# Patient Record
Sex: Female | Born: 1985 | Marital: Married | Smoking: Never smoker
Health system: Southern US, Community
[De-identification: ages and names within clinical notes are randomized; demographics above are authoritative.]

---

## 2014-09-08 ENCOUNTER — Emergency Department: Payer: Self-pay | Admitting: Emergency Medicine

## 2014-09-08 LAB — URINALYSIS, COMPLETE
BACTERIA: NONE SEEN
Bilirubin,UR: NEGATIVE
Blood: NEGATIVE
Glucose,UR: NEGATIVE mg/dL (ref 0–75)
Ketone: NEGATIVE
Leukocyte Esterase: NEGATIVE
Nitrite: NEGATIVE
Ph: 6 (ref 4.5–8.0)
Protein: NEGATIVE
RBC, UR: NONE SEEN /HPF (ref 0–5)
SPECIFIC GRAVITY: 1.015 (ref 1.003–1.030)
WBC UR: NONE SEEN /HPF (ref 0–5)

## 2014-09-08 LAB — BASIC METABOLIC PANEL
Anion Gap: 8 (ref 7–16)
BUN: 7 mg/dL (ref 7–18)
CHLORIDE: 106 mmol/L (ref 98–107)
Calcium, Total: 8.3 mg/dL — ABNORMAL LOW (ref 8.5–10.1)
Co2: 25 mmol/L (ref 21–32)
Creatinine: 0.74 mg/dL (ref 0.60–1.30)
EGFR (Non-African Amer.): >60
Glucose: 91 mg/dL (ref 65–99)
Osmolality: 275 (ref 275–301)
POTASSIUM: 3.8 mmol/L (ref 3.5–5.1)
Sodium: 139 mmol/L (ref 136–145)

## 2014-09-08 LAB — CBC
HCT: 38.4 % (ref 35.0–47.0)
HGB: 12.9 g/dL (ref 12.0–16.0)
MCH: 30.5 pg (ref 26.0–34.0)
MCHC: 33.5 g/dL (ref 32.0–36.0)
MCV: 91 fL (ref 80–100)
PLATELETS: 227 10*3/uL (ref 150–440)
RBC: 4.22 10*6/uL (ref 3.80–5.20)
RDW: 12.5 % (ref 11.5–14.5)
WBC: 10.8 10*3/uL (ref 3.6–11.0)

## 2014-09-08 LAB — HCG, QUANTITATIVE, PREGNANCY: BETA HCG, QUANT.: 167101 m[IU]/mL — AB

## 2014-09-08 LAB — WET PREP, GENITAL

## 2014-09-09 LAB — GC/CHLAMYDIA PROBE AMP

## 2014-09-17 ENCOUNTER — Ambulatory Visit: Payer: Self-pay | Admitting: Obstetrics and Gynecology

## 2014-09-17 LAB — HCG, QUANTITATIVE, PREGNANCY: Beta Hcg, Quant.: >200000 m[IU]/mL — ABNORMAL HIGH

## 2014-09-30 LAB — OB RESULTS CONSOLE VARICELLA ZOSTER ANTIBODY, IGG: VARICELLA IGG: NON-IMMUNE/NOT IMMUNE

## 2014-10-17 NOTE — L&D Delivery Note (Signed)
Delivery Note Ms. Gerilyn PilgrimJacob had a labor that was complicated by chorioamnionitis along with grade 2 meconium. She progressed well without unnecessary augmentation. She pushed with good effort, near to the point of exhaustion, but did really well with the support of her husband and nursing staff. Antiobiotics were started at the first confirmation of chorioamnionitis. At 7:21 AM a viable female was delivered via Vaginal, Spontaneous Delivery (Presentation:OA ).  APGAR: 2, 8; weight 7 lb 12.5 oz (3530 g).   Placenta status: Intact, Spontaneous.  Cord: 3 vessels with the following complications: .  Cord pH:7.13 (WNL)  Anesthesia: Epidural  Episiotomy:  None Lacerations:  2nd degree Suture Repair: vicryl 0 Est. Blood Loss (mL):  500 Mom to postpartum.  Baby to nursery due to chorioamnionitis  Sharmila Wrobleski MICHAEL 04/28/2015, 8:12 AM

## 2015-04-02 LAB — OB RESULTS CONSOLE GC/CHLAMYDIA
CHLAMYDIA, DNA PROBE: NEGATIVE
GC PROBE AMP, GENITAL: NEGATIVE

## 2015-04-02 LAB — OB RESULTS CONSOLE GBS: STREP GROUP B AG: POSITIVE

## 2015-04-02 LAB — OB RESULTS CONSOLE RPR: RPR: NONREACTIVE

## 2015-04-27 ENCOUNTER — Inpatient Hospital Stay: Payer: 59 | Admitting: Anesthesiology

## 2015-04-27 ENCOUNTER — Inpatient Hospital Stay
Admission: EM | Admit: 2015-04-27 | Discharge: 2015-04-30 | DRG: 774 | Disposition: A | Discharge status: Home / Self Care | Payer: 59 | Attending: Obstetrics and Gynecology | Admitting: Obstetrics and Gynecology

## 2015-04-27 DIAGNOSIS — O99824 Streptococcus B carrier state complicating childbirth: Secondary | ICD-10-CM | POA: Diagnosis present

## 2015-04-27 DIAGNOSIS — IMO0001 Reserved for inherently not codable concepts without codable children: Secondary | ICD-10-CM

## 2015-04-27 DIAGNOSIS — I959 Hypotension, unspecified: Secondary | ICD-10-CM | POA: Diagnosis not present

## 2015-04-27 DIAGNOSIS — Z3A39 39 weeks gestation of pregnancy: Secondary | ICD-10-CM | POA: Diagnosis present

## 2015-04-27 DIAGNOSIS — O9089 Other complications of the puerperium, not elsewhere classified: Secondary | ICD-10-CM | POA: Diagnosis not present

## 2015-04-27 DIAGNOSIS — O411230 Chorioamnionitis, third trimester, not applicable or unspecified: Principal | ICD-10-CM | POA: Diagnosis present

## 2015-04-27 LAB — CBC
HCT: 40.2 % (ref 35.0–47.0)
HEMOGLOBIN: 13.6 g/dL (ref 12.0–16.0)
MCH: 30.8 pg (ref 26.0–34.0)
MCHC: 33.7 g/dL (ref 32.0–36.0)
MCV: 91.3 fL (ref 80.0–100.0)
Platelets: 191 10*3/uL (ref 150–440)
RBC: 4.41 MIL/uL (ref 3.80–5.20)
RDW: 13.4 % (ref 11.5–14.5)
WBC: 19.2 10*3/uL — ABNORMAL HIGH (ref 3.6–11.0)

## 2015-04-27 LAB — TYPE AND SCREEN
ABO/RH(D): B POS
ANTIBODY SCREEN: NEGATIVE

## 2015-04-27 MED ORDER — SODIUM CHLORIDE 0.9 % IV SOLN
2.0000 g | Freq: Four times a day (QID) | INTRAVENOUS | Status: DC
  Administered 2015-04-28 (×2): 2 g via INTRAVENOUS
  Filled 2015-04-27 (×2): qty 2000

## 2015-04-27 MED ORDER — CITRIC ACID-SODIUM CITRATE 334-500 MG/5ML PO SOLN: 30.0000 mL | ORAL | Status: DC | PRN

## 2015-04-27 MED ORDER — ACETAMINOPHEN 325 MG PO TABS
650.0000 mg | ORAL_TABLET | ORAL | Status: DC | PRN
  Administered 2015-04-27: 650 mg via ORAL
  Filled 2015-04-27: qty 2

## 2015-04-27 MED ORDER — PENICILLIN G POTASSIUM 5000000 UNITS IJ SOLR
2.5000 10*6.[IU] | INTRAVENOUS | Status: DC
  Administered 2015-04-27: 2.5 10*6.[IU] via INTRAVENOUS
  Filled 2015-04-27 (×5): qty 2.5

## 2015-04-27 MED ORDER — GENTAMICIN SULFATE 40 MG/ML IJ SOLN
1.5000 mg/kg | Freq: Three times a day (TID) | INTRAVENOUS | Status: DC
  Administered 2015-04-28: 110 mg via INTRAVENOUS
  Filled 2015-04-27 (×5): qty 2.75

## 2015-04-27 MED ORDER — FENTANYL 2.5 MCG/ML W/ROPIVACAINE 0.2% IN NS 100 ML EPIDURAL INFUSION (ARMC-ANES)
10.0000 mL/h | EPIDURAL | Status: DC
  Administered 2015-04-27: 10 mL/h via EPIDURAL

## 2015-04-27 MED ORDER — OXYTOCIN BOLUS FROM INFUSION: 500.0000 mL | INTRAVENOUS | Status: DC

## 2015-04-27 MED ORDER — LIDOCAINE-EPINEPHRINE (PF) 1.5 %-1:200000 IJ SOLN
INTRAMUSCULAR | Status: DC | PRN
  Administered 2015-04-27: 3 mL via EPIDURAL

## 2015-04-27 MED ORDER — LACTATED RINGERS IV SOLN
INTRAVENOUS | Status: DC
  Administered 2015-04-27 (×2): via INTRAVENOUS

## 2015-04-27 MED ORDER — LIDOCAINE HCL (PF) 1 % IJ SOLN: 30.0000 mL | INTRAMUSCULAR | Status: DC | PRN

## 2015-04-27 MED ORDER — ONDANSETRON HCL 4 MG/2ML IJ SOLN: 4.0000 mg | Freq: Four times a day (QID) | INTRAMUSCULAR | Status: DC | PRN

## 2015-04-27 MED ORDER — SODIUM CHLORIDE 0.9 % IJ SOLN
INTRAMUSCULAR | Status: AC
  Filled 2015-04-27: qty 10

## 2015-04-27 MED ORDER — OXYTOCIN 40 UNITS IN LACTATED RINGERS INFUSION - SIMPLE MED
62.5000 mL/h | INTRAVENOUS | Status: DC
  Administered 2015-04-28: 40 [IU] via INTRAVENOUS

## 2015-04-27 MED ORDER — EPHEDRINE SULFATE 50 MG/ML IJ SOLN
INTRAMUSCULAR | Status: AC
  Filled 2015-04-27: qty 1

## 2015-04-27 MED ORDER — FENTANYL 2.5 MCG/ML W/ROPIVACAINE 0.2% IN NS 100 ML EPIDURAL INFUSION (ARMC-ANES)
EPIDURAL | Status: AC
  Administered 2015-04-27: 10 mL/h via EPIDURAL
  Filled 2015-04-27: qty 100

## 2015-04-27 MED ORDER — PHENYLEPHRINE 40 MCG/ML (10ML) SYRINGE FOR IV PUSH (FOR BLOOD PRESSURE SUPPORT): 80.0000 ug | PREFILLED_SYRINGE | INTRAVENOUS | Status: DC | PRN

## 2015-04-27 MED ORDER — BUPIVACAINE HCL (PF) 0.25 % IJ SOLN
INTRAMUSCULAR | Status: DC | PRN
  Administered 2015-04-27 (×2): 4 mL

## 2015-04-27 MED ORDER — PENICILLIN G POTASSIUM 5000000 UNITS IJ SOLR
5.0000 10*6.[IU] | Freq: Once | INTRAVENOUS | Status: AC
  Administered 2015-04-27: 5 10*6.[IU] via INTRAVENOUS
  Filled 2015-04-27: qty 5

## 2015-04-27 MED ORDER — EPHEDRINE 5 MG/ML INJ
10.0000 mg | Freq: Once | INTRAVENOUS | Status: AC
  Administered 2015-04-27: 5 mg via INTRAVENOUS

## 2015-04-27 MED ORDER — DIPHENHYDRAMINE HCL 50 MG/ML IJ SOLN: 12.5000 mg | INTRAMUSCULAR | Status: DC | PRN

## 2015-04-27 MED ORDER — EPHEDRINE 5 MG/ML INJ: 10.0000 mg | INTRAVENOUS | Status: DC | PRN

## 2015-04-27 MED ORDER — LACTATED RINGERS IV SOLN: 500.0000 mL | INTRAVENOUS | Status: DC | PRN

## 2015-04-27 MED ORDER — BUTORPHANOL TARTRATE 1 MG/ML IJ SOLN: 1.0000 mg | INTRAMUSCULAR | Status: DC | PRN

## 2015-04-27 NOTE — Anesthesia Preprocedure Evaluation (Signed)
Anesthesia Evaluation  Patient identified by MRN, date of birth, ID band Patient awake    Reviewed: Allergy & Precautions, NPO status , Patient's Chart, lab work & pertinent test results  History of Anesthesia Complications Negative for: history of anesthetic complications  Airway Mallampati: II  TM Distance: >3 FB Neck ROM: Full    Dental no notable dental hx.    Pulmonary neg pulmonary ROS,  breath sounds clear to auscultation  Pulmonary exam normal       Cardiovascular negative cardio ROS Normal cardiovascular examRhythm:Regular Rate:Normal     Neuro/Psych negative neurological ROS  negative psych ROS   GI/Hepatic negative GI ROS, Neg liver ROS,   Endo/Other  negative endocrine ROS  Renal/GU negative Renal ROS  negative genitourinary   Musculoskeletal negative musculoskeletal ROS (+)   Abdominal   Peds negative pediatric ROS (+)  Hematology negative hematology ROS (+)   Anesthesia Other Findings   Reproductive/Obstetrics (+) Pregnancy                             Anesthesia Physical Anesthesia Plan  ASA: II  Anesthesia Plan: Epidural   Post-op Pain Management:    Induction:   Airway Management Planned:   Additional Equipment:   Intra-op Plan:   Post-operative Plan:   Informed Consent: I have reviewed the patients History and Physical, chart, labs and discussed the procedure including the risks, benefits and alternatives for the proposed anesthesia with the patient or authorized representative who has indicated his/her understanding and acceptance.     Plan Discussed with:   Anesthesia Plan Comments:         Anesthesia Quick Evaluation  

## 2015-04-27 NOTE — Anesthesia Procedure Notes (Signed)
Epidural Patient location during procedure: OB Start time: 04/27/2015 7:45 PM End time: 04/27/2015 7:51 PM  Staffing Anesthesiologist: Forestine ChutePOLIN, Fortino Haag Performed by: anesthesiologist   Preanesthetic Checklist Completed: patient identified, site marked, surgical consent, pre-op evaluation, timeout performed, IV checked, risks and benefits discussed and monitors and equipment checked  Epidural Patient position: sitting Prep: Betadine Patient monitoring: heart rate, continuous pulse ox and blood pressure Approach: midline Location: L4-L5 Injection technique: LOR saline  Needle:  Needle type: Tuohy  Needle gauge: 18 G Needle length: 9 cm and 9 Needle insertion depth: 5 cm Catheter type: closed end flexible Catheter size: 20 Guage Catheter at skin depth: 10 cm Test dose: negative and 1.5% lidocaine with Epi 1:200 K  Assessment Events: blood not aspirated, injection not painful, no injection resistance, negative IV test and no paresthesia  Additional Notes   Patient tolerated the insertion well without complications.Reason for block:procedure for pain

## 2015-04-27 NOTE — Progress Notes (Signed)
S: Resting comfortably with epidural. + CTX, no LOF, VB O: Temp:  [98.1 F (36.7 C)-100.9 F (38.3 C)] 100.9 F (38.3 C) (07/11 2241) Pulse Rate:  [96-138] 138 (07/11 2327) Resp:  [20] 20 (07/11 2031) BP: (79-148)/(35-116) 92/50 mmHg (07/11 2327) SpO2:  [100 %] 100 % (07/11 2134) Weight:  [165 lb 5.5 oz (75 kg)] 165 lb 5.5 oz (75 kg) (07/11 1556)   Gen: NAD, AAOx3      Abd: FNTTP      Ext: Non-tender, Nonedmeatous    FHT: 170 mod var + accelerations no decelerations TOCO: Q2-3 min SVE: 7/90/0 vtx, arom'd clear fluid with blood tinged   A/P:  29 y.o. yo G1P0 at 1847w4d by 8 week US with labor complicated by chorioamnionitis.   Labor: Progressing well, chorioamnionitis now by definition of fever despite Tylenol and fetal tachycardia.   FWB: Reassuring Cat 1 tracing. EFW 3400g  GBS: + being treated  Contraception: not discussed   GOODMAN, DAVID MICHAEL 11:48 PM

## 2015-04-27 NOTE — H&P (Signed)
HISTORY AND PHYSICAL  HISTORY OF PRESENT ILLNESS: Ms. Diane Lopez is a 29 y.o. G1 at 39+4 by 8 week ultrasound with a pregnancy presenting with active labor.   She has been having contractions Q5-10 throughout today and presents now with small amount of bleeding PV, bulging bag of water and cervical change from 3 to 4 cm dilated.    She has had an uncomplicated pregnancy. B+ Rub Imm VZV non-immune GTT GTT 126 Hgb 11.8 GBS + no allx  REVIEW OF SYSTEMS: A complete review of systems was performed and was specifically negative for headache, changes in vision, RUQ pain, shortness of breath, chest pain, lower extremity edema and dysuria.   HISTORY:  No past medical history on file.  No past surgical history on file.  No current facility-administered medications on file prior to encounter.   No current outpatient prescriptions on file prior to encounter.     No Known Allergies  OB History  No data available    Gynecologic History: History of Abnormal Pap Smear: NILM History of STI: none  History  Substance Use Topics  . Smoking status: Not on file  . Smokeless tobacco: Not on file  . Alcohol Use: Not on file    PHYSICAL EXAM: Temp:  [98.1 F (36.7 C)] 98.1 F (36.7 C) (07/11 1556) Weight:  [75 kg (165 lb 5.5 oz)] 75 kg (165 lb 5.5 oz) (07/11 1556)  GENERAL: NAD AAOx3 CHEST:CTAB no increased work of breathing CV:RRR no appreciable murmurs, rubs, gallops ABDOMEN: gravid, nontender, EFW 3200g by [place] EXTREMITIES:  Warm and well-perfused, nontender, nonedematous, 2+ DTRs no clonus CERVIX: 4/ 75/ -2 BBOW SPECULUM: deferred  FHT:150s baseline with mod variability + accelerations and no decelerations  Toco: Q3  DIAGNOSTIC STUDIES: No results for input(s): WBC, HGB, HCT, PLT, NA, K, CL, CO2, BUN, CREATININE, LABGLOM, GLUCOSE, CALCIUM, BILIDIR, ALKPHOS, AST, ALT, PROT, MG in the last 168 hours.  Invalid input(s): LABALB, UA  PRENATAL STUDIES:  Prenatal Labs:  MBT: B+;  Rubella immune, Varicella non immune, HIV neg, RPR neg, Hep B neg, GC/CT neg, GBS positive, glucola 126   ASSESSMENT AND PLAN:  1. Fetal Well being  - Fetal Tracing: reassuring - Ultrasound: reviewed, as above - Group B Streptococcus: positive - Presentation: vtx confirmed by US   2. Routine OB: - Prenatal labs reviewed, as above - Rh +  3. Labor:  -  Contractions external toco in place -  Pelvis unproven -  Plan for epidural  4. Post Partum Planning: - Infant feeding: breast - Contraception: not discussed

## 2015-04-28 ENCOUNTER — Encounter: Payer: Self-pay | Admitting: *Deleted

## 2015-04-28 LAB — HIV ANTIBODY (ROUTINE TESTING W REFLEX): HIV Screen 4th Generation wRfx: NONREACTIVE

## 2015-04-28 LAB — RPR: RPR Ser Ql: NONREACTIVE

## 2015-04-28 MED ORDER — ACETAMINOPHEN 325 MG PO TABS: 650.0000 mg | ORAL_TABLET | ORAL | Status: DC | PRN

## 2015-04-28 MED ORDER — OXYTOCIN 40 UNITS IN LACTATED RINGERS INFUSION - SIMPLE MED
INTRAVENOUS | Status: AC
  Administered 2015-04-28: 40 [IU] via INTRAVENOUS
  Filled 2015-04-28: qty 1000

## 2015-04-28 MED ORDER — OXYCODONE-ACETAMINOPHEN 5-325 MG PO TABS
1.0000 | ORAL_TABLET | ORAL | Status: DC | PRN
  Administered 2015-04-28 (×3): 1 via ORAL
  Filled 2015-04-28: qty 1

## 2015-04-28 MED ORDER — METHYLERGONOVINE MALEATE 0.2 MG/ML IJ SOLN
0.2000 mg | Freq: Once | INTRAMUSCULAR | Status: AC
  Administered 2015-04-28: 0.2 mg via INTRAMUSCULAR

## 2015-04-28 MED ORDER — METHYLERGONOVINE MALEATE 0.2 MG/ML IJ SOLN
INTRAMUSCULAR | Status: AC
  Administered 2015-04-28: 0.2 mg via INTRAMUSCULAR
  Filled 2015-04-28: qty 1

## 2015-04-28 MED ORDER — SENNOSIDES-DOCUSATE SODIUM 8.6-50 MG PO TABS
2.0000 | ORAL_TABLET | ORAL | Status: DC
  Administered 2015-04-28: 2 via ORAL
  Filled 2015-04-28: qty 2

## 2015-04-28 MED ORDER — ONDANSETRON HCL 4 MG PO TABS: 4.0000 mg | ORAL_TABLET | ORAL | Status: DC | PRN

## 2015-04-28 MED ORDER — ZOLPIDEM TARTRATE 5 MG PO TABS
5.0000 mg | ORAL_TABLET | Freq: Every evening | ORAL | Status: DC | PRN
Start: 2015-04-28 — End: 2015-04-30

## 2015-04-28 MED ORDER — PRENATAL MULTIVITAMIN CH
1.0000 | ORAL_TABLET | Freq: Every day | ORAL | Status: DC
  Administered 2015-04-28 – 2015-04-29 (×2): 1 via ORAL
  Filled 2015-04-28 (×2): qty 1

## 2015-04-28 MED ORDER — SIMETHICONE 80 MG PO CHEW: 80.0000 mg | CHEWABLE_TABLET | ORAL | Status: DC | PRN

## 2015-04-28 MED ORDER — LIDOCAINE HCL (PF) 1 % IJ SOLN
INTRAMUSCULAR | Status: AC
  Filled 2015-04-28: qty 30

## 2015-04-28 MED ORDER — IBUPROFEN 600 MG PO TABS
600.0000 mg | ORAL_TABLET | Freq: Four times a day (QID) | ORAL | Status: DC
  Administered 2015-04-29 (×2): 600 mg via ORAL
  Filled 2015-04-28 (×2): qty 1

## 2015-04-28 MED ORDER — OXYTOCIN 10 UNIT/ML IJ SOLN
INTRAMUSCULAR | Status: AC
  Filled 2015-04-28: qty 2

## 2015-04-28 MED ORDER — AMMONIA AROMATIC IN INHA
RESPIRATORY_TRACT | Status: AC
  Filled 2015-04-28: qty 10

## 2015-04-28 MED ORDER — OXYCODONE-ACETAMINOPHEN 5-325 MG PO TABS
2.0000 | ORAL_TABLET | ORAL | Status: DC | PRN
  Administered 2015-04-28: 2 via ORAL
  Filled 2015-04-28 (×2): qty 2

## 2015-04-28 MED ORDER — DIBUCAINE 1 % RE OINT: 1.0000 "application " | TOPICAL_OINTMENT | RECTAL | Status: DC | PRN

## 2015-04-28 MED ORDER — GENTAMICIN SULFATE 40 MG/ML IJ SOLN
5.0000 mg/kg | Freq: Once | INTRAVENOUS | Status: AC
  Administered 2015-04-28: 380 mg via INTRAVENOUS
  Filled 2015-04-28: qty 9.5

## 2015-04-28 MED ORDER — FENTANYL 2.5 MCG/ML W/ROPIVACAINE 0.2% IN NS 100 ML EPIDURAL INFUSION (ARMC-ANES)
EPIDURAL | Status: AC
  Filled 2015-04-28: qty 100

## 2015-04-28 MED ORDER — BENZOCAINE-MENTHOL 20-0.5 % EX AERO: 1.0000 "application " | INHALATION_SPRAY | CUTANEOUS | Status: DC | PRN

## 2015-04-28 MED ORDER — WITCH HAZEL-GLYCERIN EX PADS: 1.0000 "application " | MEDICATED_PAD | CUTANEOUS | Status: DC | PRN

## 2015-04-28 MED ORDER — TETANUS-DIPHTH-ACELL PERTUSSIS 5-2.5-18.5 LF-MCG/0.5 IM SUSP: 0.5000 mL | Freq: Once | INTRAMUSCULAR | Status: DC

## 2015-04-28 MED ORDER — DIPHENHYDRAMINE HCL 25 MG PO CAPS: 25.0000 mg | ORAL_CAPSULE | Freq: Four times a day (QID) | ORAL | Status: DC | PRN

## 2015-04-28 MED ORDER — ONDANSETRON HCL 4 MG/2ML IJ SOLN: 4.0000 mg | INTRAMUSCULAR | Status: DC | PRN

## 2015-04-28 MED ORDER — LANOLIN HYDROUS EX OINT: TOPICAL_OINTMENT | CUTANEOUS | Status: DC | PRN

## 2015-04-28 MED ORDER — OXYTOCIN 40 UNITS IN LACTATED RINGERS INFUSION - SIMPLE MED
125.0000 mL/h | INTRAVENOUS | Status: DC | PRN
  Administered 2015-04-29: 125 mL/h via INTRAVENOUS
  Filled 2015-04-28 (×2): qty 1000

## 2015-04-28 MED ORDER — MISOPROSTOL 200 MCG PO TABS
ORAL_TABLET | ORAL | Status: AC
  Filled 2015-04-28: qty 4

## 2015-04-28 MED ORDER — SODIUM CHLORIDE 0.9 % IV SOLN
2.0000 g | Freq: Four times a day (QID) | INTRAVENOUS | Status: AC
  Administered 2015-04-28 – 2015-04-29 (×3): 2 g via INTRAVENOUS
  Filled 2015-04-28 (×5): qty 2000

## 2015-04-28 NOTE — Plan of Care (Signed)
With encouragement from RN and husband, pt up to void. Voided very small amounts but continued to use peribottle to encourage flow of urine. Pericare demonstrated and performed by pt. Standing bath given to pt. She states that pain has decreased and that she is comfortable standing for care. Back to rocking chair while waiting for room to be prepared on MBU. Infant remains in SCN for care. FOB at side with mother. Pt taking PO fluids well. She does not want regular diet tray. Her family will bring her food from home.  Ellison Carwin Dacotah Cabello RNC

## 2015-04-29 LAB — CBC
HCT: 35.3 % (ref 35.0–47.0)
HEMOGLOBIN: 12 g/dL (ref 12.0–16.0)
MCH: 31.4 pg (ref 26.0–34.0)
MCHC: 34.1 g/dL (ref 32.0–36.0)
MCV: 92.2 fL (ref 80.0–100.0)
PLATELETS: 174 10*3/uL (ref 150–440)
RBC: 3.82 MIL/uL (ref 3.80–5.20)
RDW: 13.7 % (ref 11.5–14.5)
WBC: 30.2 10*3/uL — AB (ref 3.6–11.0)

## 2015-04-29 MED ORDER — LACTATED RINGERS IV BOLUS (SEPSIS)
500.0000 mL | Freq: Once | INTRAVENOUS | Status: AC
  Administered 2015-04-29: 500 mL via INTRAVENOUS

## 2015-04-29 NOTE — Progress Notes (Signed)
Notified San JettyLaurie Neese, RN of bp

## 2015-04-29 NOTE — Progress Notes (Signed)
Post Partum Day 1 dx with chorioamnionitis . On Amp Gent . Afebrile since delivery . Baby on ABX . Pt is tired Subjective: tired  Objective: Blood pressure 85/43, pulse 72, temperature 98.5 F (36.9 C), temperature source Oral, resp. rate 20, height 5' 4.96" (1.65 m), weight 165 lb 5.5 oz (75 kg), SpO2 99 %, unknown if currently breastfeeding.  Physical Exam:  General: alert and cooperative  Lungs CTA  CV RRR Lochia: appropriate Uterine Fundus: firm, non tender  Incision:n/a DVT Evaluation: No evidence of DVT seen on physical exam.   Recent Labs  04/27/15 1850 04/29/15 0509  HGB 13.6 12.0  HCT 40.2 35.3  WBC 30 K   Assessment/Plan: Resolving chorio, afebrile with significant elevation of WBC Fluid bolus for relative hypotension  If temp > 100.4  Or clinically she look to develop PPE restart abx with gent + cleocin    LOS: 2 days   SCHERMERHORN,THOMAS 04/29/2015, 8:40 AM

## 2015-04-29 NOTE — Anesthesia Postprocedure Evaluation (Signed)
  Anesthesia Post-op Note  Patient: Diane Lopez  Procedure(s) Performed: CLE  Anesthesia type:Epidural  Patient location: PACU  Post pain: Pain level controlled  Post assessment: Post-op Vital signs reviewed, Patient's Cardiovascular Status Stable, Respiratory Function Stable, Patent Airway and No signs of Nausea or vomiting  Post vital signs: Reviewed and stable  Last Vitals:  Filed Vitals:   04/29/15 0453  BP: 94/60  Pulse: 98  Temp: 36.7 C  Resp: 18    Level of consciousness: awake, alert  and patient cooperative  Complications: No apparent anesthesia complications

## 2015-04-29 NOTE — Progress Notes (Signed)
Notify RN of bp

## 2015-04-30 LAB — CBC
HEMATOCRIT: 37.2 % (ref 35.0–47.0)
HEMOGLOBIN: 12.1 g/dL (ref 12.0–16.0)
MCH: 30.2 pg (ref 26.0–34.0)
MCHC: 32.6 g/dL (ref 32.0–36.0)
MCV: 92.5 fL (ref 80.0–100.0)
Platelets: 198 10*3/uL (ref 150–440)
RBC: 4.02 MIL/uL (ref 3.80–5.20)
RDW: 13.7 % (ref 11.5–14.5)
WBC: 24.2 10*3/uL — ABNORMAL HIGH (ref 3.6–11.0)

## 2015-04-30 MED ORDER — OXYCODONE-ACETAMINOPHEN 5-325 MG PO TABS: 1.0000 | ORAL_TABLET | ORAL | Status: AC | PRN

## 2015-04-30 NOTE — Progress Notes (Signed)
Post Partum Day 2 dx with chorioamnionitis . s/p Amp Gent . Afebrile since delivery . Pt is doing well, breast feeding, min pain. Ready for discharge.   Subjective: Doing well.   Objective: Filed Vitals:   04/30/15 0840  BP: 98/64  Pulse: 100  Temp: 98.1 F (36.7 C)  Resp:      Physical Exam:  General: alert and cooperative  Lungs CTA  CV RRR Lochia: appropriate Uterine Fundus: firm, non tender  Incision:n/a DVT Evaluation: No evidence of DVT seen on physical exam.   Recent Labs  04/29/15 0509 04/30/15 0549  HGB 12.0 12.1  HCT 35.3 37.2  WBC 30 K   Assessment/Plan: Resolved chorio, afebrile x 48 hrs D/C home if infant cleared from peds Precautions given to patient RTC for 4-6wk PP visit   LOS: 3 days   Ala DachJohanna K Berniece Abid 04/30/2015, 9:50 AM

## 2015-05-01 LAB — SURGICAL PATHOLOGY

## 2015-05-15 LAB — ABO/RH: ABO/RH(D): B POS

## 2015-07-14 NOTE — Discharge Summary (Signed)
NAMETIMBER, MARSHMAN             ACCOUNT NO.:  [id number]  MEDICAL RECORD NO.:  [id number]  LOCATION:  340A                         FACILITY:  [facility]  PHYSICIAN:  Jennell Corner, MDDATE OF BIRTH:  12-Mar-1986  DATE OF ADMISSION:  04/27/2015 DATE OF DISCHARGE:  04/30/2015                              DISCHARGE SUMMARY   The patient underwent spontaneous vaginal delivery which was complicated by chorioamnionitis.  The patient was treated with antibiotics postpartum and defervesced and was discharged to home in good condition. Postop day #1, hematocrit of 35.3%, white blood count of 30,000.  On day of discharge, white blood count 24,000 and hematocrit of 37.2.  The patient remained afebrile for the last 24 hours and was discharged to home in good condition.  She will follow up with Dr. __________ or Dr. Feliberto Gottron, in 6 weeks for 6-week postpartum check.          ______________________________ Jennell Corner, MD     TS/MEDQ  D:  07/13/2015  T:  07/14/2015  Job:  [id number]

## 2016-10-27 IMAGING — US US OB < 14 WEEKS - US OB TV
1 series · 14 of 28 positions shown · non-contrast
Comparison: None.

CLINICAL DATA: 28-year-old with abdominal pain and vaginal
bleeding.

EXAM:
OBSTETRIC <14 WK US AND TRANSVAGINAL OB US
TECHNIQUE: Both transabdominal and transvaginal ultrasound examinations were
performed for complete evaluation of the gestation as well as the
maternal uterus, adnexal regions, and pelvic cul-de-sac.
Transvaginal technique was performed to assess early pregnancy.

[Series 1: us ob < 14 weeks - us ob tv · 0.20mm/px · 14 of 61 slices shown]
[im 3/61]
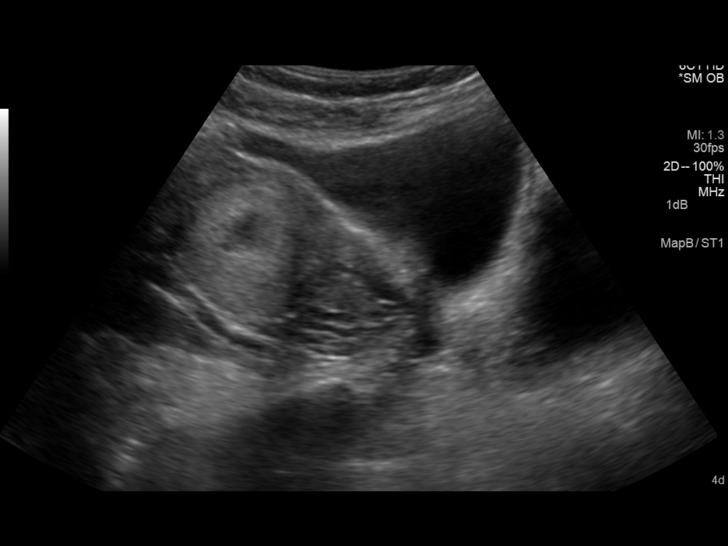
[im 7/61]
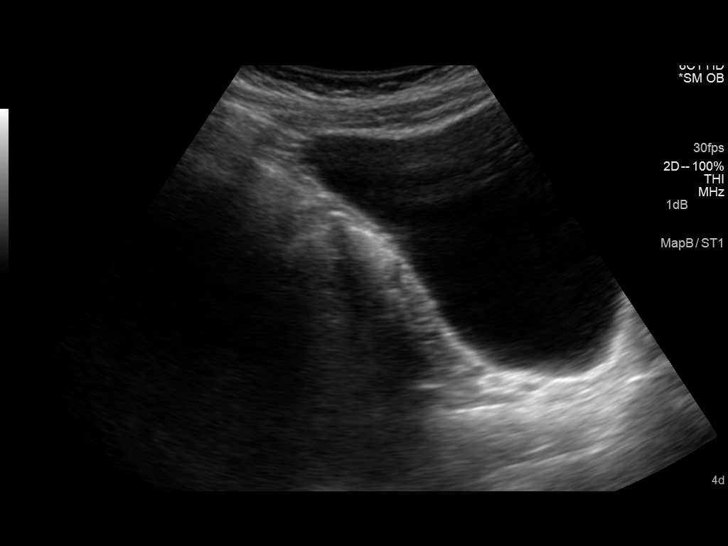
[im 12/61]
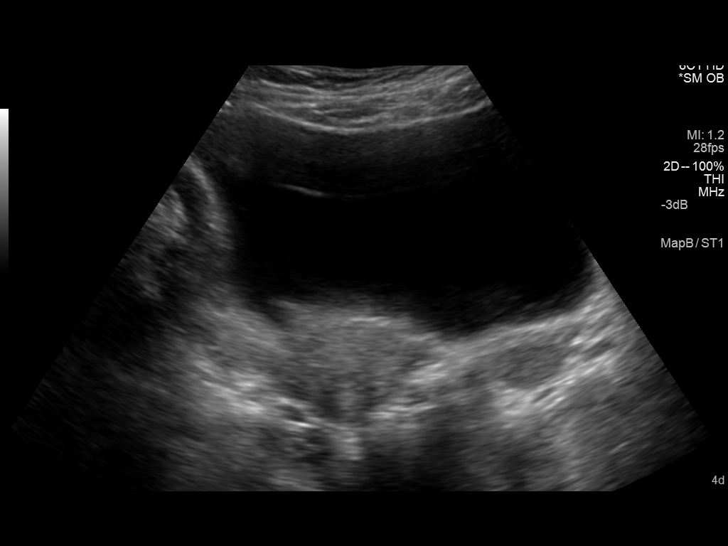
[im 16/61]
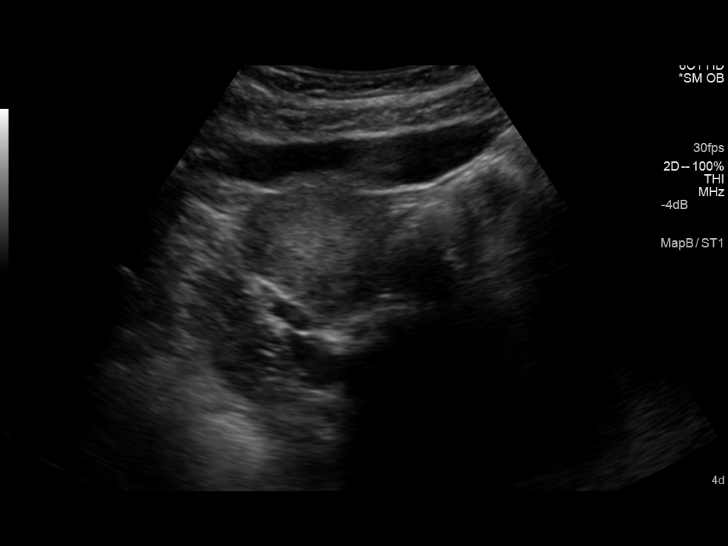
[im 21/61]
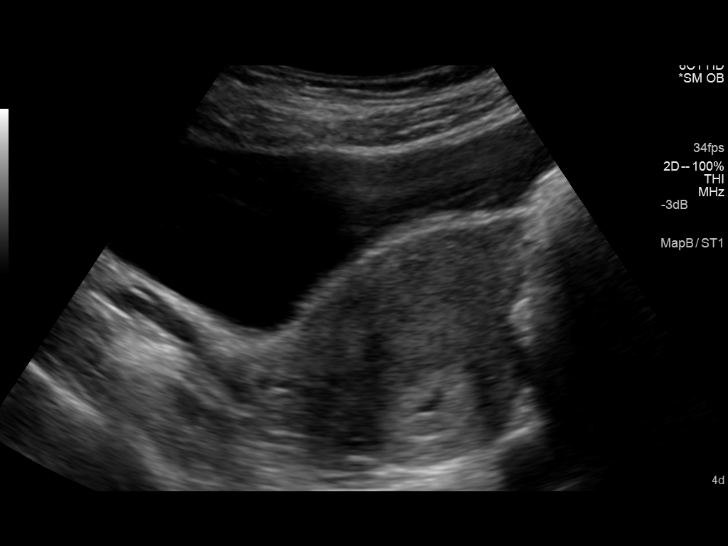
[im 25/61]
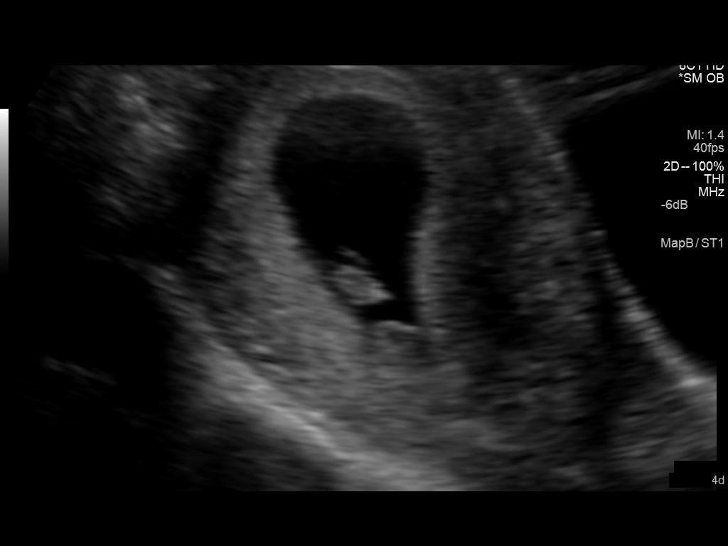
[im 29/61]
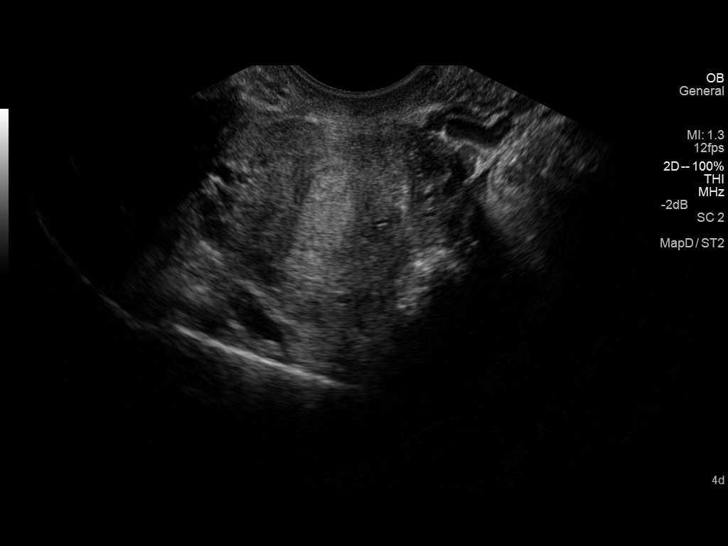
[im 34/61]
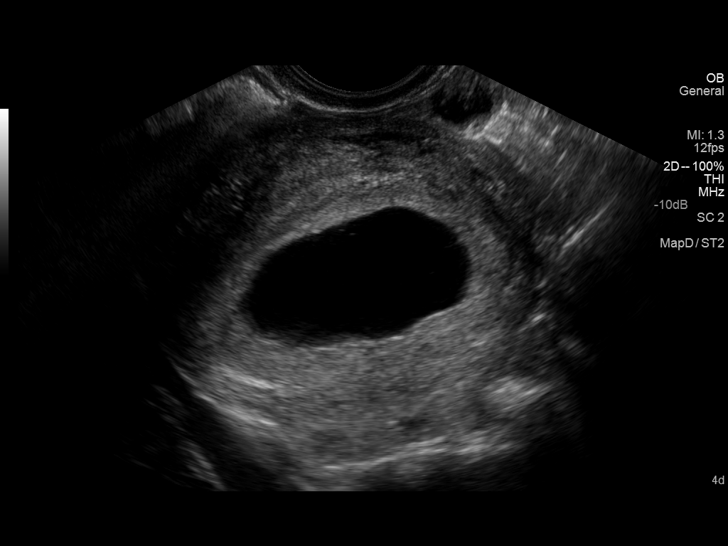
[im 38/61]
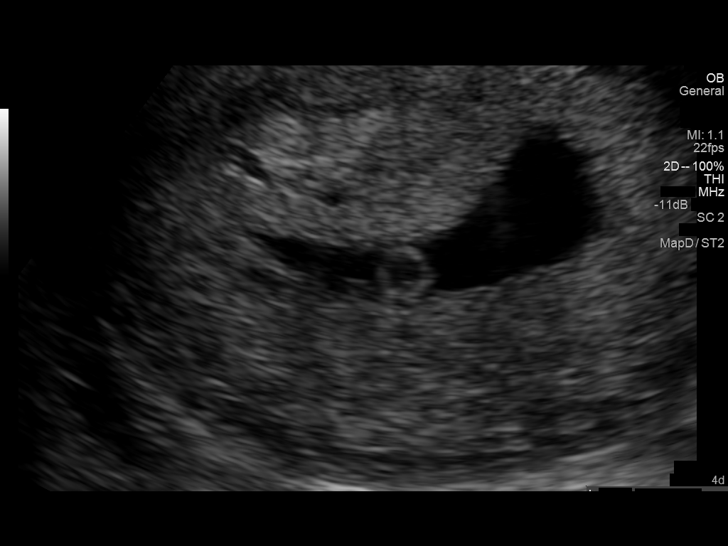
[im 43/61]
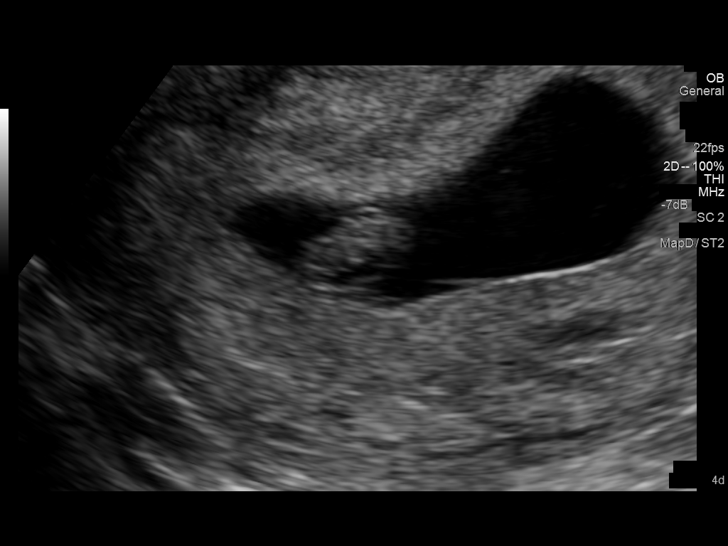
[im 47/61]
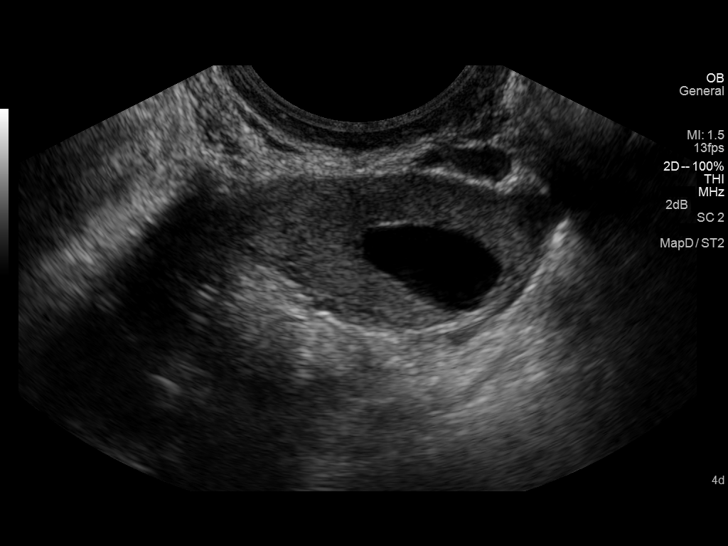
[im 52/61]
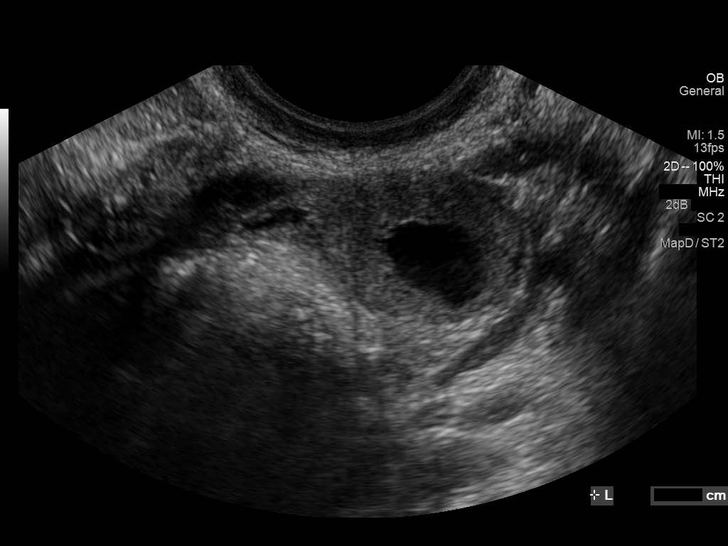
[im 56/61]
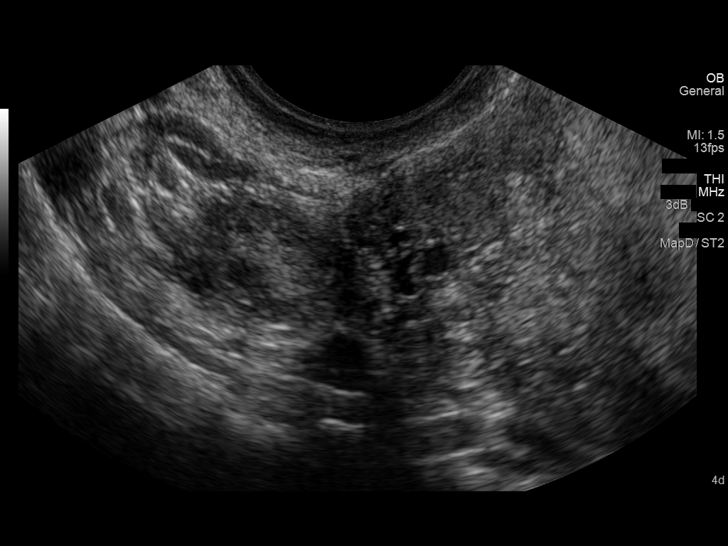
[im 61/61]
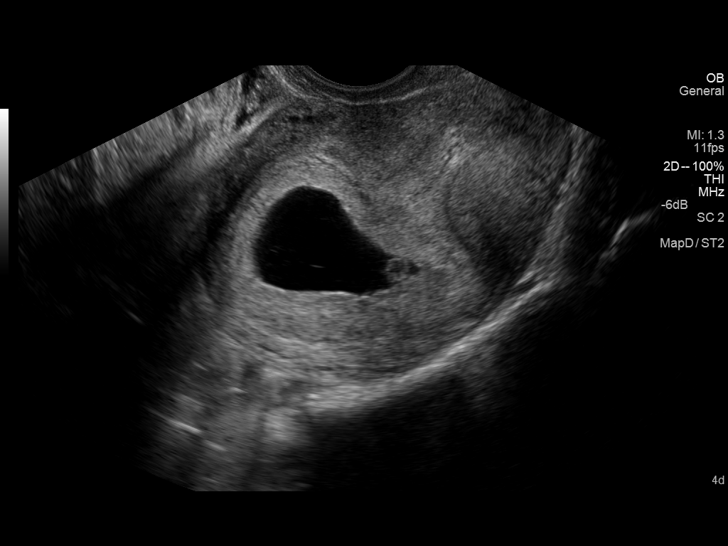

[14 of 28 positions shown; findings below may reference images not displayed]

FINDINGS: Intrauterine gestational sac: Visualized/normal in shape.

Yolk sac:  Present

Embryo:  Present

Cardiac Activity: Present

Heart Rate:  147 bpm

CRL:   10  mm   7 w 0 d                  US EDC: 04/27/2015

Maternal uterus/adnexae: Intrauterine gestational sac with fetal
pole and yolk sac. No evidence for a subchorionic hemorrhage. Left
ovary measures 3.3 x 1.6 x 2.8 cm. Probable corpus luteum cyst
measuring up to 2.5 cm in the left ovary. Normal appearance of the
right ovary measuring 2.2 x 1.5 x 1.8 cm. No free fluid.
IMPRESSION: Single live intrauterine pregnancy. Calculated gestational age by
ultrasound is 7 weeks and 0 days.

## 2017-07-19 DIAGNOSIS — N912 Amenorrhea, unspecified: Secondary | ICD-10-CM | POA: Diagnosis not present

## 2017-07-19 DIAGNOSIS — Z3201 Encounter for pregnancy test, result positive: Secondary | ICD-10-CM | POA: Diagnosis not present

## 2017-08-04 DIAGNOSIS — Z1151 Encounter for screening for human papillomavirus (HPV): Secondary | ICD-10-CM | POA: Diagnosis not present

## 2017-08-04 DIAGNOSIS — N390 Urinary tract infection, site not specified: Secondary | ICD-10-CM | POA: Diagnosis not present

## 2017-08-04 DIAGNOSIS — Z3481 Encounter for supervision of other normal pregnancy, first trimester: Secondary | ICD-10-CM | POA: Diagnosis not present

## 2017-08-04 DIAGNOSIS — Z5181 Encounter for therapeutic drug level monitoring: Secondary | ICD-10-CM | POA: Diagnosis not present

## 2017-08-04 DIAGNOSIS — Z113 Encounter for screening for infections with a predominantly sexual mode of transmission: Secondary | ICD-10-CM | POA: Diagnosis not present

## 2017-08-04 DIAGNOSIS — Z3491 Encounter for supervision of normal pregnancy, unspecified, first trimester: Secondary | ICD-10-CM | POA: Diagnosis not present
